# Patient Record
Sex: Male | Born: 1950 | Race: White | Hispanic: No | Marital: Married | Smoking: Former smoker
Health system: Southern US, Community
[De-identification: ages and names within clinical notes are randomized; demographics above are authoritative.]

## PROBLEM LIST (undated history)

## (undated) DIAGNOSIS — S066XAA Traumatic subarachnoid hemorrhage with loss of consciousness status unknown, initial encounter: Secondary | ICD-10-CM

## (undated) DIAGNOSIS — R339 Retention of urine, unspecified: Secondary | ICD-10-CM

## (undated) DIAGNOSIS — K219 Gastro-esophageal reflux disease without esophagitis: Secondary | ICD-10-CM

## (undated) DIAGNOSIS — K2970 Gastritis, unspecified, without bleeding: Secondary | ICD-10-CM

## (undated) DIAGNOSIS — E039 Hypothyroidism, unspecified: Secondary | ICD-10-CM

## (undated) DIAGNOSIS — Z982 Presence of cerebrospinal fluid drainage device: Secondary | ICD-10-CM

## (undated) DIAGNOSIS — S066X9A Traumatic subarachnoid hemorrhage with loss of consciousness of unspecified duration, initial encounter: Secondary | ICD-10-CM

## (undated) DIAGNOSIS — A498 Other bacterial infections of unspecified site: Secondary | ICD-10-CM

## (undated) DIAGNOSIS — S069X9A Unspecified intracranial injury with loss of consciousness of unspecified duration, initial encounter: Secondary | ICD-10-CM

## (undated) DIAGNOSIS — C801 Malignant (primary) neoplasm, unspecified: Secondary | ICD-10-CM

## (undated) DIAGNOSIS — J189 Pneumonia, unspecified organism: Secondary | ICD-10-CM

## (undated) DIAGNOSIS — J9690 Respiratory failure, unspecified, unspecified whether with hypoxia or hypercapnia: Secondary | ICD-10-CM

## (undated) DIAGNOSIS — S0291XA Unspecified fracture of skull, initial encounter for closed fracture: Secondary | ICD-10-CM

## (undated) DIAGNOSIS — J449 Chronic obstructive pulmonary disease, unspecified: Secondary | ICD-10-CM

## (undated) DIAGNOSIS — J150 Pneumonia due to Klebsiella pneumoniae: Secondary | ICD-10-CM

## (undated) HISTORY — PX: MELANOMA EXCISION: SHX5266

## (undated) HISTORY — PX: TRACHEOSTOMY: SUR1362

## (undated) HISTORY — PX: EYE SURGERY: SHX253

## (undated) HISTORY — PX: OTHER SURGICAL HISTORY: SHX169

## (undated) HISTORY — PX: PEG PLACEMENT: SHX5437

---

## 2012-11-10 DIAGNOSIS — J189 Pneumonia, unspecified organism: Secondary | ICD-10-CM

## 2012-11-10 HISTORY — DX: Pneumonia, unspecified organism: J18.9

## 2013-06-10 DIAGNOSIS — S0291XA Unspecified fracture of skull, initial encounter for closed fracture: Secondary | ICD-10-CM

## 2013-06-10 DIAGNOSIS — S0990XA Unspecified injury of head, initial encounter: Secondary | ICD-10-CM

## 2013-06-10 HISTORY — DX: Unspecified fracture of skull, initial encounter for closed fracture: S02.91XA

## 2013-06-10 HISTORY — DX: Unspecified injury of head, initial encounter: S09.90XA

## 2013-11-23 ENCOUNTER — Encounter (HOSPITAL_COMMUNITY): Payer: Self-pay | Admitting: *Deleted
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
## 2013-11-25 ENCOUNTER — Inpatient Hospital Stay (HOSPITAL_COMMUNITY): Payer: Medicare Other | Admitting: Certified Registered Nurse Anesthetist

## 2013-11-25 ENCOUNTER — Inpatient Hospital Stay (HOSPITAL_COMMUNITY): Payer: Medicare Other

## 2013-11-25 ENCOUNTER — Encounter (HOSPITAL_COMMUNITY): Payer: Self-pay | Admitting: *Deleted

## 2013-11-25 ENCOUNTER — Encounter (HOSPITAL_COMMUNITY): Payer: Medicare Other | Admitting: Certified Registered Nurse Anesthetist

## 2013-11-25 ENCOUNTER — Encounter (HOSPITAL_COMMUNITY)
Admission: RE | Disposition: A | Discharge status: Nursing Home (Includes ICF) | Payer: Self-pay | Source: Ambulatory Visit | Attending: Neurosurgery

## 2013-11-25 ENCOUNTER — Inpatient Hospital Stay (HOSPITAL_COMMUNITY)
Admission: RE | Admit: 2013-11-25 | Discharge: 2013-11-28 | DRG: 031 | Disposition: A | Discharge status: Other Acute Inpatient Hospital | Payer: Medicare Other | Source: Ambulatory Visit | Attending: Neurosurgery | Admitting: Neurosurgery

## 2013-11-25 DIAGNOSIS — K219 Gastro-esophageal reflux disease without esophagitis: Secondary | ICD-10-CM | POA: Diagnosis present

## 2013-11-25 DIAGNOSIS — Z79899 Other long term (current) drug therapy: Secondary | ICD-10-CM

## 2013-11-25 DIAGNOSIS — Z87891 Personal history of nicotine dependence: Secondary | ICD-10-CM

## 2013-11-25 DIAGNOSIS — J4489 Other specified chronic obstructive pulmonary disease: Secondary | ICD-10-CM | POA: Diagnosis present

## 2013-11-25 DIAGNOSIS — G9340 Encephalopathy, unspecified: Secondary | ICD-10-CM | POA: Diagnosis present

## 2013-11-25 DIAGNOSIS — T85618A Breakdown (mechanical) of other specified internal prosthetic devices, implants and grafts, initial encounter: Secondary | ICD-10-CM | POA: Diagnosis present

## 2013-11-25 DIAGNOSIS — Y831 Surgical operation with implant of artificial internal device as the cause of abnormal reaction of the patient, or of later complication, without mention of misadventure at the time of the procedure: Secondary | ICD-10-CM | POA: Diagnosis present

## 2013-11-25 DIAGNOSIS — S069XAA Unspecified intracranial injury with loss of consciousness status unknown, initial encounter: Secondary | ICD-10-CM

## 2013-11-25 DIAGNOSIS — Z931 Gastrostomy status: Secondary | ICD-10-CM

## 2013-11-25 DIAGNOSIS — J9620 Acute and chronic respiratory failure, unspecified whether with hypoxia or hypercapnia: Secondary | ICD-10-CM

## 2013-11-25 DIAGNOSIS — E039 Hypothyroidism, unspecified: Secondary | ICD-10-CM | POA: Diagnosis present

## 2013-11-25 DIAGNOSIS — S069X9A Unspecified intracranial injury with loss of consciousness of unspecified duration, initial encounter: Secondary | ICD-10-CM | POA: Diagnosis present

## 2013-11-25 DIAGNOSIS — Z930 Tracheostomy status: Secondary | ICD-10-CM

## 2013-11-25 DIAGNOSIS — Z87820 Personal history of traumatic brain injury: Secondary | ICD-10-CM

## 2013-11-25 DIAGNOSIS — Z888 Allergy status to other drugs, medicaments and biological substances status: Secondary | ICD-10-CM

## 2013-11-25 DIAGNOSIS — T85695A Other mechanical complication of other nervous system device, implant or graft, initial encounter: Principal | ICD-10-CM | POA: Diagnosis present

## 2013-11-25 DIAGNOSIS — I614 Nontraumatic intracerebral hemorrhage in cerebellum: Secondary | ICD-10-CM | POA: Diagnosis present

## 2013-11-25 DIAGNOSIS — G919 Hydrocephalus, unspecified: Secondary | ICD-10-CM | POA: Diagnosis present

## 2013-11-25 DIAGNOSIS — Z85820 Personal history of malignant melanoma of skin: Secondary | ICD-10-CM

## 2013-11-25 DIAGNOSIS — D72829 Elevated white blood cell count, unspecified: Secondary | ICD-10-CM | POA: Diagnosis present

## 2013-11-25 DIAGNOSIS — I10 Essential (primary) hypertension: Secondary | ICD-10-CM | POA: Diagnosis present

## 2013-11-25 DIAGNOSIS — J449 Chronic obstructive pulmonary disease, unspecified: Secondary | ICD-10-CM | POA: Diagnosis present

## 2013-11-25 HISTORY — DX: Retention of urine, unspecified: R33.9

## 2013-11-25 HISTORY — DX: Other bacterial infections of unspecified site: A49.8

## 2013-11-25 HISTORY — DX: Traumatic subarachnoid hemorrhage with loss of consciousness of unspecified duration, initial encounter: S06.6X9A

## 2013-11-25 HISTORY — DX: Hypothyroidism, unspecified: E03.9

## 2013-11-25 HISTORY — DX: Gastritis, unspecified, without bleeding: K29.70

## 2013-11-25 HISTORY — DX: Unspecified fracture of skull, initial encounter for closed fracture: S02.91XA

## 2013-11-25 HISTORY — PX: SHUNT REVISION VENTRICULAR-PERITONEAL: SHX6094

## 2013-11-25 HISTORY — DX: Pneumonia due to Klebsiella pneumoniae: J15.0

## 2013-11-25 HISTORY — DX: Malignant (primary) neoplasm, unspecified: C80.1

## 2013-11-25 HISTORY — DX: Traumatic subarachnoid hemorrhage with loss of consciousness status unknown, initial encounter: S06.6XAA

## 2013-11-25 HISTORY — DX: Pneumonia, unspecified organism: J18.9

## 2013-11-25 HISTORY — DX: Respiratory failure, unspecified, unspecified whether with hypoxia or hypercapnia: J96.90

## 2013-11-25 HISTORY — DX: Gastro-esophageal reflux disease without esophagitis: K21.9

## 2013-11-25 HISTORY — DX: Unspecified intracranial injury with loss of consciousness of unspecified duration, initial encounter: S06.9X9A

## 2013-11-25 HISTORY — DX: Presence of cerebrospinal fluid drainage device: Z98.2

## 2013-11-25 HISTORY — DX: Chronic obstructive pulmonary disease, unspecified: J44.9

## 2013-11-25 LAB — BASIC METABOLIC PANEL
BUN: 23 mg/dL (ref 6–23)
CHLORIDE: 101 meq/L (ref 96–112)
CO2: 29 mEq/L (ref 19–32)
CREATININE: 0.87 mg/dL (ref 0.50–1.35)
Calcium: 10.6 mg/dL — ABNORMAL HIGH (ref 8.4–10.5)
GFR calc Af Amer: >90 mL/min (ref >90)
GFR calc non Af Amer: 89 mL/min — ABNORMAL LOW (ref >90)
GLUCOSE: 111 mg/dL — AB (ref 70–99)
POTASSIUM: 4.5 meq/L (ref 3.7–5.3)
Sodium: 144 mEq/L (ref 137–147)

## 2013-11-25 LAB — CBC WITH DIFFERENTIAL/PLATELET
BASOS PCT: 0 % (ref 0–1)
Basophils Absolute: 0 10*3/uL (ref 0.0–0.1)
Eosinophils Absolute: 0.8 10*3/uL — ABNORMAL HIGH (ref 0.0–0.7)
Eosinophils Relative: 5 % (ref 0–5)
HEMATOCRIT: 40.1 % (ref 39.0–52.0)
Hemoglobin: 13.1 g/dL (ref 13.0–17.0)
LYMPHS ABS: 1.6 10*3/uL (ref 0.7–4.0)
Lymphocytes Relative: 10 % — ABNORMAL LOW (ref 12–46)
MCH: 29.4 pg (ref 26.0–34.0)
MCHC: 32.7 g/dL (ref 30.0–36.0)
MCV: 90.1 fL (ref 78.0–100.0)
Monocytes Absolute: 1.1 10*3/uL — ABNORMAL HIGH (ref 0.1–1.0)
Monocytes Relative: 7 % (ref 3–12)
NEUTROS ABS: 11.8 10*3/uL — AB (ref 1.7–7.7)
NEUTROS PCT: 77 % (ref 43–77)
Platelets: 395 10*3/uL (ref 150–400)
RBC: 4.45 MIL/uL (ref 4.22–5.81)
RDW: 16.2 % — ABNORMAL HIGH (ref 11.5–15.5)
WBC: 15.4 10*3/uL — ABNORMAL HIGH (ref 4.0–10.5)

## 2013-11-25 LAB — MRSA PCR SCREENING: MRSA by PCR: NEGATIVE

## 2013-11-25 SURGERY — REVISION, SHUNT, VENTRICULOPERITONEAL
Anesthesia: General

## 2013-11-25 MED ORDER — PEPTAMEN AF PO LIQD: 70.0000 mL | ORAL | Status: DC

## 2013-11-25 MED ORDER — SODIUM CHLORIDE 0.9 % IJ SOLN: 3.0000 mL | INTRAMUSCULAR | Status: DC | PRN

## 2013-11-25 MED ORDER — 0.9 % SODIUM CHLORIDE (POUR BTL) OPTIME
TOPICAL | Status: DC | PRN
  Administered 2013-11-25: 1000 mL

## 2013-11-25 MED ORDER — CEFAZOLIN SODIUM-DEXTROSE 2-3 GM-% IV SOLR
INTRAVENOUS | Status: AC
  Administered 2013-11-25: 2 g via INTRAVENOUS
  Filled 2013-11-25: qty 50

## 2013-11-25 MED ORDER — PHENOL 1.4 % MT LIQD: 1.0000 | OROMUCOSAL | Status: DC | PRN

## 2013-11-25 MED ORDER — DOCUSATE SODIUM 100 MG PO CAPS
100.0000 mg | ORAL_CAPSULE | Freq: Every day | ORAL | Status: DC
  Filled 2013-11-25: qty 1

## 2013-11-25 MED ORDER — LACOSAMIDE 50 MG PO TABS
100.0000 mg | ORAL_TABLET | Freq: Two times a day (BID) | ORAL | Status: DC
  Administered 2013-11-25 – 2013-11-28 (×6): 100 mg via ORAL
  Filled 2013-11-25 (×12): qty 2

## 2013-11-25 MED ORDER — BUPIVACAINE HCL (PF) 0.25 % IJ SOLN
INTRAMUSCULAR | Status: DC | PRN
  Administered 2013-11-25: 4 mL

## 2013-11-25 MED ORDER — MENTHOL 3 MG MT LOZG: 1.0000 | LOZENGE | OROMUCOSAL | Status: DC | PRN

## 2013-11-25 MED ORDER — SUCRALFATE 1 G PO TABS
1.0000 g | ORAL_TABLET | Freq: Three times a day (TID) | ORAL | Status: DC
  Administered 2013-11-25 – 2013-11-28 (×10): 1 g
  Filled 2013-11-25 (×11): qty 1

## 2013-11-25 MED ORDER — CHLORHEXIDINE GLUCONATE 0.12 % MT SOLN
15.0000 mL | Freq: Two times a day (BID) | OROMUCOSAL | Status: DC
  Administered 2013-11-25 – 2013-11-28 (×6): 15 mL via OROMUCOSAL
  Filled 2013-11-25 (×5): qty 15

## 2013-11-25 MED ORDER — LORAZEPAM 0.5 MG PO TABS
0.2500 mg | ORAL_TABLET | Freq: Two times a day (BID) | ORAL | Status: DC | PRN
  Administered 2013-11-26: 0.25 mg
  Filled 2013-11-25: qty 1

## 2013-11-25 MED ORDER — CEFAZOLIN SODIUM 1-5 GM-% IV SOLN
1.0000 g | Freq: Three times a day (TID) | INTRAVENOUS | Status: AC
  Administered 2013-11-25 – 2013-11-26 (×2): 1 g via INTRAVENOUS
  Filled 2013-11-25 (×4): qty 50

## 2013-11-25 MED ORDER — PHENYLEPHRINE HCL 10 MG/ML IJ SOLN
10.0000 mg | INTRAVENOUS | Status: DC | PRN
  Administered 2013-11-25: 40 ug/min via INTRAVENOUS

## 2013-11-25 MED ORDER — ACETAMINOPHEN 325 MG PO TABS: 650.0000 mg | ORAL_TABLET | ORAL | Status: DC | PRN

## 2013-11-25 MED ORDER — MUPIROCIN 2 % EX OINT
TOPICAL_OINTMENT | CUTANEOUS | Status: AC
  Filled 2013-11-25: qty 22

## 2013-11-25 MED ORDER — LEVOTHYROXINE SODIUM 75 MCG PO TABS
75.0000 ug | ORAL_TABLET | Freq: Every day | ORAL | Status: DC
  Administered 2013-11-26 – 2013-11-28 (×3): 75 ug
  Filled 2013-11-25 (×4): qty 1

## 2013-11-25 MED ORDER — QUETIAPINE FUMARATE 50 MG PO TABS
50.0000 mg | ORAL_TABLET | Freq: Three times a day (TID) | ORAL | Status: DC
  Administered 2013-11-25 – 2013-11-28 (×10): 50 mg
  Filled 2013-11-25 (×12): qty 1

## 2013-11-25 MED ORDER — CLONAZEPAM 0.5 MG PO TABS
0.5000 mg | ORAL_TABLET | Freq: Three times a day (TID) | ORAL | Status: DC | PRN
  Administered 2013-11-27: 0.5 mg
  Filled 2013-11-25: qty 1

## 2013-11-25 MED ORDER — LACTATED RINGERS IV SOLN
INTRAVENOUS | Status: DC
Start: 2013-11-25 — End: 2013-11-25
  Administered 2013-11-25: 10:00:00 via INTRAVENOUS

## 2013-11-25 MED ORDER — IPRATROPIUM-ALBUTEROL 0.5-2.5 (3) MG/3ML IN SOLN: 3.0000 mL | Freq: Four times a day (QID) | RESPIRATORY_TRACT | Status: DC | PRN

## 2013-11-25 MED ORDER — LIDOCAINE HCL (CARDIAC) 20 MG/ML IV SOLN
INTRAVENOUS | Status: DC | PRN
  Administered 2013-11-25: 30 mg via INTRAVENOUS

## 2013-11-25 MED ORDER — PANTOPRAZOLE SODIUM 40 MG PO TBEC
40.0000 mg | DELAYED_RELEASE_TABLET | Freq: Two times a day (BID) | ORAL | Status: DC
  Administered 2013-11-25: 40 mg via ORAL
  Filled 2013-11-25: qty 1

## 2013-11-25 MED ORDER — SODIUM CHLORIDE 0.9 % IJ SOLN
3.0000 mL | Freq: Two times a day (BID) | INTRAMUSCULAR | Status: DC
  Administered 2013-11-25 – 2013-11-28 (×5): 3 mL via INTRAVENOUS

## 2013-11-25 MED ORDER — ACETAMINOPHEN 650 MG RE SUPP: 650.0000 mg | RECTAL | Status: DC | PRN

## 2013-11-25 MED ORDER — ALUM & MAG HYDROXIDE-SIMETH 200-200-20 MG/5ML PO SUSP: 30.0000 mL | Freq: Four times a day (QID) | ORAL | Status: DC | PRN

## 2013-11-25 MED ORDER — ONDANSETRON HCL 4 MG/2ML IJ SOLN: 4.0000 mg | INTRAMUSCULAR | Status: DC | PRN

## 2013-11-25 MED ORDER — VITAL AF 1.2 CAL PO LIQD
70.0000 mL/h | ORAL | Status: DC
  Filled 2013-11-25 (×14): qty 237

## 2013-11-25 MED ORDER — ROCURONIUM BROMIDE 100 MG/10ML IV SOLN
INTRAVENOUS | Status: DC | PRN
  Administered 2013-11-25: 20 mg via INTRAVENOUS

## 2013-11-25 MED ORDER — POTASSIUM CHLORIDE 20 MEQ/15ML (10%) PO LIQD
40.0000 meq | Freq: Every day | ORAL | Status: DC
  Administered 2013-11-25 – 2013-11-28 (×4): 40 meq
  Filled 2013-11-25 (×4): qty 30

## 2013-11-25 MED ORDER — PROPOFOL 10 MG/ML IV BOLUS
INTRAVENOUS | Status: DC | PRN
  Administered 2013-11-25: 30 mg via INTRAVENOUS

## 2013-11-25 MED ORDER — CEFAZOLIN SODIUM-DEXTROSE 2-3 GM-% IV SOLR: 2.0000 g | INTRAVENOUS | Status: DC

## 2013-11-25 MED ORDER — GLYCOPYRROLATE 0.2 MG/ML IJ SOLN
INTRAMUSCULAR | Status: DC | PRN
  Administered 2013-11-25: .4 mg via INTRAVENOUS

## 2013-11-25 MED ORDER — SODIUM CHLORIDE 0.9 % IV SOLN
INTRAVENOUS | Status: DC
  Administered 2013-11-25 – 2013-11-26 (×2): via INTRAVENOUS

## 2013-11-25 MED ORDER — FLUTICASONE PROPIONATE 50 MCG/ACT NA SUSP
1.0000 | Freq: Every day | NASAL | Status: DC
  Administered 2013-11-26 – 2013-11-28 (×3): 1 via NASAL
  Filled 2013-11-25: qty 16

## 2013-11-25 MED ORDER — POTASSIUM CHLORIDE CRYS ER 20 MEQ PO TBCR: 40.0000 meq | EXTENDED_RELEASE_TABLET | Freq: Every day | ORAL | Status: DC

## 2013-11-25 MED ORDER — BISACODYL 10 MG RE SUPP: 10.0000 mg | Freq: Every day | RECTAL | Status: DC | PRN

## 2013-11-25 MED ORDER — BETHANECHOL CHLORIDE 25 MG PO TABS
50.0000 mg | ORAL_TABLET | Freq: Three times a day (TID) | ORAL | Status: DC
  Administered 2013-11-25 – 2013-11-28 (×10): 50 mg via JEJUNOSTOMY
  Filled 2013-11-25 (×11): qty 2

## 2013-11-25 MED ORDER — SERTRALINE HCL 50 MG PO TABS
50.0000 mg | ORAL_TABLET | Freq: Every day | ORAL | Status: DC
  Administered 2013-11-25 – 2013-11-27 (×3): 50 mg
  Filled 2013-11-25 (×4): qty 1

## 2013-11-25 MED ORDER — ZINC SULFATE 220 (50 ZN) MG PO CAPS
220.0000 mg | ORAL_CAPSULE | Freq: Every day | ORAL | Status: DC
  Administered 2013-11-26 – 2013-11-28 (×3): 220 mg
  Filled 2013-11-25 (×4): qty 1

## 2013-11-25 MED ORDER — HYDROMORPHONE HCL PF 1 MG/ML IJ SOLN: 0.5000 mg | INTRAMUSCULAR | Status: DC | PRN

## 2013-11-25 MED ORDER — DOXAZOSIN MESYLATE 1 MG PO TABS
1.0000 mg | ORAL_TABLET | Freq: Every day | ORAL | Status: DC
  Administered 2013-11-25 – 2013-11-27 (×3): 1 mg
  Filled 2013-11-25 (×4): qty 1

## 2013-11-25 MED ORDER — OXYCODONE HCL 5 MG PO TABS: 5.0000 mg | ORAL_TABLET | ORAL | Status: DC | PRN

## 2013-11-25 MED ORDER — SODIUM CHLORIDE 0.9 % IV SOLN: 250.0000 mL | INTRAVENOUS | Status: DC

## 2013-11-25 MED ORDER — NYSTATIN 100000 UNIT/GM EX POWD
15.0000 g | Freq: Two times a day (BID) | CUTANEOUS | Status: DC
  Administered 2013-11-25 – 2013-11-28 (×6): 15 g via TOPICAL
  Filled 2013-11-25 (×2): qty 15

## 2013-11-25 MED ORDER — DOCUSATE SODIUM 50 MG/5ML PO LIQD
100.0000 mg | Freq: Every day | ORAL | Status: DC
  Administered 2013-11-25 – 2013-11-28 (×4): 100 mg
  Filled 2013-11-25 (×5): qty 10

## 2013-11-25 MED ORDER — LACTATED RINGERS IV SOLN
INTRAVENOUS | Status: DC | PRN
  Administered 2013-11-25: 15:00:00 via INTRAVENOUS

## 2013-11-25 MED ORDER — NEOSTIGMINE METHYLSULFATE 1 MG/ML IJ SOLN
INTRAMUSCULAR | Status: DC | PRN
  Administered 2013-11-25: 3 mg via INTRAVENOUS

## 2013-11-25 MED ORDER — PROPRANOLOL HCL 10 MG PO TABS
10.0000 mg | ORAL_TABLET | Freq: Three times a day (TID) | ORAL | Status: DC
  Administered 2013-11-25 – 2013-11-28 (×10): 10 mg
  Filled 2013-11-25 (×11): qty 1

## 2013-11-25 MED ORDER — LOPERAMIDE HCL 2 MG PO CAPS
2.0000 mg | ORAL_CAPSULE | ORAL | Status: DC | PRN
  Filled 2013-11-25: qty 1

## 2013-11-25 MED ORDER — SODIUM CHLORIDE 0.9 % IR SOLN
Status: DC | PRN
  Administered 2013-11-25: 15:00:00

## 2013-11-25 MED ORDER — MUPIROCIN 2 % EX OINT
1.0000 "application " | TOPICAL_OINTMENT | Freq: Three times a day (TID) | CUTANEOUS | Status: DC
  Administered 2013-11-25 – 2013-11-28 (×9): 1 via NASAL
  Filled 2013-11-25: qty 22

## 2013-11-25 MED ORDER — SENNA 8.6 MG PO TABS
1.0000 | ORAL_TABLET | Freq: Two times a day (BID) | ORAL | Status: DC
  Administered 2013-11-25 – 2013-11-28 (×6): 8.6 mg via ORAL
  Filled 2013-11-25 (×7): qty 1

## 2013-11-25 SURGICAL SUPPLY — 80 items
BAG DECANTER FOR FLEXI CONT (MISCELLANEOUS) ×3 IMPLANT
BANDAGE ADHESIVE 1X3 (GAUZE/BANDAGES/DRESSINGS) ×9 IMPLANT
BANDAGE GAUZE ELAST BULKY 4 IN (GAUZE/BANDAGES/DRESSINGS) IMPLANT
BENZOIN TINCTURE PRP APPL 2/3 (GAUZE/BANDAGES/DRESSINGS) ×3 IMPLANT
BLADE SURG 10 STRL SS (BLADE) ×6 IMPLANT
BLADE SURG 11 STRL SS (BLADE) ×3 IMPLANT
BRUSH SCRUB EZ 1% IODOPHOR (MISCELLANEOUS) ×3 IMPLANT
BRUSH SCRUB EZ PLAIN DRY (MISCELLANEOUS) ×3 IMPLANT
BUR ACORN 6.0 PRECISION (BURR) ×2 IMPLANT
BUR ACORN 6.0MM PRECISION (BURR) ×1
CANISTER SUCT 3000ML (MISCELLANEOUS) ×3 IMPLANT
CATH VENTRICULAR 14CMX1.4MM (INSTRUMENTS) ×6 IMPLANT
CLIP RANEY DISP (INSTRUMENTS) IMPLANT
CONT SPEC 4OZ CLIKSEAL STRL BL (MISCELLANEOUS) IMPLANT
CORDS BIPOLAR (ELECTRODE) ×3 IMPLANT
COVER MAYO STAND STRL (DRAPES) ×3 IMPLANT
DRAPE INCISE IOBAN 85X60 (DRAPES) ×3 IMPLANT
DRAPE ORTHO SPLIT 77X108 STRL (DRAPES) ×2
DRAPE POUCH INSTRU U-SHP 10X18 (DRAPES) ×3 IMPLANT
DRAPE SURG 17X23 STRL (DRAPES) IMPLANT
DRAPE SURG ORHT 6 SPLT 77X108 (DRAPES) ×1 IMPLANT
DRESSING TELFA 8X3 (GAUZE/BANDAGES/DRESSINGS) ×3 IMPLANT
DRSG OPSITE 4X5.5 SM (GAUZE/BANDAGES/DRESSINGS) ×3 IMPLANT
ELECT CAUTERY BLADE 6.4 (BLADE) ×3 IMPLANT
ELECT REM PT RETURN 9FT ADLT (ELECTROSURGICAL) ×3
ELECTRODE REM PT RTRN 9FT ADLT (ELECTROSURGICAL) ×1 IMPLANT
GLOVE BIOGEL PI IND STRL 6.5 (GLOVE) ×1 IMPLANT
GLOVE BIOGEL PI INDICATOR 6.5 (GLOVE) ×2
GLOVE ECLIPSE 8.5 STRL (GLOVE) ×3 IMPLANT
GLOVE EXAM NITRILE LRG STRL (GLOVE) IMPLANT
GLOVE EXAM NITRILE MD LF STRL (GLOVE) IMPLANT
GLOVE EXAM NITRILE XL STR (GLOVE) IMPLANT
GLOVE EXAM NITRILE XS STR PU (GLOVE) IMPLANT
GLOVE INDICATOR 7.5 STRL GRN (GLOVE) ×3 IMPLANT
GLOVE SS BIOGEL STRL SZ 6.5 (GLOVE) ×1 IMPLANT
GLOVE SUPERSENSE BIOGEL SZ 6.5 (GLOVE) ×2
GLOVE SURG SS PI 7.0 STRL IVOR (GLOVE) ×3 IMPLANT
GOWN BRE IMP SLV AUR LG STRL (GOWN DISPOSABLE) IMPLANT
GOWN BRE IMP SLV AUR XL STRL (GOWN DISPOSABLE) IMPLANT
GOWN STRL REIN 2XL LVL4 (GOWN DISPOSABLE) IMPLANT
HEMOSTAT SURGICEL 2X14 (HEMOSTASIS) IMPLANT
KIT BASIN OR (CUSTOM PROCEDURE TRAY) ×3 IMPLANT
KIT ROOM TURNOVER OR (KITS) ×3 IMPLANT
MARKER SKIN DUAL TIP RULER LAB (MISCELLANEOUS) ×3 IMPLANT
NEEDLE BLUNT 16X1.5 OR ONLY (NEEDLE) IMPLANT
NEEDLE HYPO 18GX1.5 BLUNT FILL (NEEDLE) ×3 IMPLANT
NS IRRIG 1000ML POUR BTL (IV SOLUTION) ×3 IMPLANT
PAD ARMBOARD 7.5X6 YLW CONV (MISCELLANEOUS) ×3 IMPLANT
PATTIES SURGICAL .5 X.5 (GAUZE/BANDAGES/DRESSINGS) IMPLANT
PATTIES SURGICAL .5 X3 (DISPOSABLE) IMPLANT
PENCIL BUTTON HOLSTER BLD 10FT (ELECTRODE) ×3 IMPLANT
RUBBERBAND STERILE (MISCELLANEOUS) IMPLANT
SHEATH PERITONEAL INTRO 46 (MISCELLANEOUS) IMPLANT
SHEATH PERITONEAL INTRO 61 (MISCELLANEOUS) IMPLANT
SPONGE GAUZE 4X4 12PLY (GAUZE/BANDAGES/DRESSINGS) ×3 IMPLANT
SPONGE INTESTINAL PEANUT (DISPOSABLE) IMPLANT
SPONGE LAP 4X18 X RAY DECT (DISPOSABLE) ×3 IMPLANT
SPONGE SURGIFOAM ABS GEL SZ50 (HEMOSTASIS) IMPLANT
STAPLER VISISTAT 35W (STAPLE) ×3 IMPLANT
SUT BONE WAX W31G (SUTURE) ×3 IMPLANT
SUT CHROMIC 3 0 SH 27 (SUTURE) IMPLANT
SUT ETHILON 3 0 FSL (SUTURE) IMPLANT
SUT ETHILON 4 0 PS 2 18 (SUTURE) IMPLANT
SUT NURALON 4 0 TR CR/8 (SUTURE) IMPLANT
SUT SILK 0 TIES 10X30 (SUTURE) IMPLANT
SUT SILK 2 0 TIES 17X18 (SUTURE) ×2
SUT SILK 2-0 18XBRD TIE BLK (SUTURE) ×1 IMPLANT
SUT SILK 3 0 SH 30 (SUTURE) IMPLANT
SUT VIC AB 2-0 CT2 18 VCP726D (SUTURE) ×3 IMPLANT
SUT VIC AB 3-0 SH 8-18 (SUTURE) ×3 IMPLANT
SUT VICRYL 4-0 PS2 18IN ABS (SUTURE) IMPLANT
SYR 5ML LL (SYRINGE) IMPLANT
TAPE UMBILICAL 1/8 X36 TWILL (MISCELLANEOUS) ×3 IMPLANT
TOWEL OR 17X24 6PK STRL BLUE (TOWEL DISPOSABLE) ×6 IMPLANT
TOWEL OR 17X26 10 PK STRL BLUE (TOWEL DISPOSABLE) ×3 IMPLANT
TRAY ENT MC OR (CUSTOM PROCEDURE TRAY) ×3 IMPLANT
TRAY FOLEY CATH 14FRSI W/METER (CATHETERS) IMPLANT
UNDERPAD 30X30 INCONTINENT (UNDERPADS AND DIAPERS) ×3 IMPLANT
Ventricular catheter straight ×3 IMPLANT
WATER STERILE IRR 1000ML POUR (IV SOLUTION) ×3 IMPLANT

## 2013-11-25 NOTE — Progress Notes (Signed)
eLink Physician-Brief Progress Note Patient Name: Ruben Meyer DOB: April 22, 1951 MRN: [id number]  Date of Service  11/25/2013   HPI/Events of Note   Pt on vent s/p crani for VP shunt malfunction.  PCCM consulted for ICU/vent care  eICU Interventions  Full PCCM note to follow See orders   Intervention Category Major Interventions: Respiratory failure - evaluation and management  Asencion Noble 11/25/2013, 5:27 PM

## 2013-11-25 NOTE — Anesthesia Procedure Notes (Signed)
Performed by: Izora Gala Comments: Patient has permanent tracheostomy

## 2013-11-25 NOTE — Anesthesia Postprocedure Evaluation (Signed)
  Anesthesia Post-op Note  Patient: Ruben Meyer  Procedure(s) Performed: Procedure(s): SHUNT REVISION VENTRICULAR-PERITONEAL (N/A)  Patient Location: ICU  Anesthesia Type:General  Level of Consciousness: awake and confused  Airway and Oxygen Therapy: Patient placed on Ventilator (see vital sign flow sheet for setting)  Post-op Pain: none  Post-op Assessment: Post-op Vital signs reviewed  Post-op Vital Signs: Reviewed  Complications: No apparent anesthesia complications

## 2013-11-25 NOTE — Brief Op Note (Signed)
11/25/2013  3:42 PM  PATIENT:  Ruben Meyer  63 y.o. male  PRE-OPERATIVE DIAGNOSIS:  occluded shunt  POST-OPERATIVE DIAGNOSIS:  occluded shunt  PROCEDURE:  Procedure(s): SHUNT REVISION VENTRICULAR-PERITONEAL (N/A)  SURGEON:  Surgeon(s) and Role:    * Charlie Pitter, MD - Primary  PHYSICIAN ASSISTANT:   ASSISTANTS: none   ANESTHESIA:   general  EBL:     BLOOD ADMINISTERED:none  DRAINS: none   LOCAL MEDICATIONS USED:  LIDOCAINE   SPECIMEN:  No Specimen  DISPOSITION OF SPECIMEN:  N/A  COUNTS:  YES  TOURNIQUET:  * No tourniquets in log *  DICTATION: .Dragon Dictation  PLAN OF CARE: Admit to inpatient   PATIENT DISPOSITION:  PACU - hemodynamically stable.   Delay start of Pharmacological VTE agent (>24hrs) due to surgical blood loss or risk of bleeding: yes

## 2013-11-25 NOTE — Transfer of Care (Signed)
Immediate Anesthesia Transfer of Care Note  Patient: Ruben Meyer  Procedure(s) Performed: Procedure(s): SHUNT REVISION VENTRICULAR-PERITONEAL (N/A)  Patient Location: ICU  Anesthesia Type:General  Level of Consciousness: sedated and Patient remains intubated per anesthesia plan  Airway & Oxygen Therapy: Patient Spontanous Breathing and Patient placed on Ventilator (see vital sign flow sheet for setting)  Post-op Assessment: Report given to PACU RN and Post -op Vital signs reviewed and stable  Post vital signs: Reviewed and stable  Complications: No apparent anesthesia complications
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
## 2013-11-25 NOTE — Progress Notes (Signed)
eLink Physician-Brief Progress Note Patient Name: Ruben Meyer DOB: 08-Jun-1951 MRN: [id number]  Date of Service  11/25/2013   HPI/Events of Note  Not vent dependent No resp distress  eICU Interventions  Instructed RT to wean, TCT if able   Intervention Category Major Interventions: Respiratory failure - evaluation and management  MCQUAID, DOUGLAS 11/25/2013, 9:28 PM
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
## 2013-11-25 NOTE — Progress Notes (Signed)
Dr. Lake Bells instructed RT to place Ruben Meyer on wean through vent and if tolerated place on ATC. RT will continue to wean.
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
## 2013-11-25 NOTE — Preoperative (Signed)
Beta Blockers   Reason not to administer Beta Blockers:BB this a.m. 

## 2013-11-26 ENCOUNTER — Inpatient Hospital Stay (HOSPITAL_COMMUNITY): Payer: Medicare Other

## 2013-11-26 DIAGNOSIS — S069XAA Unspecified intracranial injury with loss of consciousness status unknown, initial encounter: Secondary | ICD-10-CM

## 2013-11-26 DIAGNOSIS — Z930 Tracheostomy status: Secondary | ICD-10-CM

## 2013-11-26 DIAGNOSIS — J9620 Acute and chronic respiratory failure, unspecified whether with hypoxia or hypercapnia: Secondary | ICD-10-CM

## 2013-11-26 DIAGNOSIS — G911 Obstructive hydrocephalus: Secondary | ICD-10-CM

## 2013-11-26 DIAGNOSIS — S069X9A Unspecified intracranial injury with loss of consciousness of unspecified duration, initial encounter: Secondary | ICD-10-CM

## 2013-11-26 LAB — BASIC METABOLIC PANEL
BUN: 25 mg/dL — ABNORMAL HIGH (ref 6–23)
CALCIUM: 10.1 mg/dL (ref 8.4–10.5)
CO2: 29 mEq/L (ref 19–32)
CREATININE: 0.82 mg/dL (ref 0.50–1.35)
Chloride: 107 mEq/L (ref 96–112)
GFR calc non Af Amer: >90 mL/min (ref >90)
Glucose, Bld: 88 mg/dL (ref 70–99)
Potassium: 4.1 mEq/L (ref 3.7–5.3)
Sodium: 148 mEq/L — ABNORMAL HIGH (ref 137–147)

## 2013-11-26 LAB — GLUCOSE, CAPILLARY
GLUCOSE-CAPILLARY: 93 mg/dL (ref 70–99)
GLUCOSE-CAPILLARY: 95 mg/dL (ref 70–99)
GLUCOSE-CAPILLARY: 99 mg/dL (ref 70–99)
Glucose-Capillary: 98 mg/dL (ref 70–99)

## 2013-11-26 LAB — URINE MICROSCOPIC-ADD ON

## 2013-11-26 LAB — CBC WITH DIFFERENTIAL/PLATELET
BASOS PCT: 0 % (ref 0–1)
Basophils Absolute: 0 10*3/uL (ref 0.0–0.1)
Eosinophils Absolute: 0.9 10*3/uL — ABNORMAL HIGH (ref 0.0–0.7)
Eosinophils Relative: 7 % — ABNORMAL HIGH (ref 0–5)
HCT: 35.6 % — ABNORMAL LOW (ref 39.0–52.0)
Hemoglobin: 11.1 g/dL — ABNORMAL LOW (ref 13.0–17.0)
Lymphocytes Relative: 14 % (ref 12–46)
Lymphs Abs: 1.8 10*3/uL (ref 0.7–4.0)
MCH: 28.9 pg (ref 26.0–34.0)
MCHC: 31.2 g/dL (ref 30.0–36.0)
MCV: 92.7 fL (ref 78.0–100.0)
Monocytes Absolute: 1.3 10*3/uL — ABNORMAL HIGH (ref 0.1–1.0)
Monocytes Relative: 10 % (ref 3–12)
NEUTROS ABS: 8.8 10*3/uL — AB (ref 1.7–7.7)
NEUTROS PCT: 69 % (ref 43–77)
Platelets: 405 10*3/uL — ABNORMAL HIGH (ref 150–400)
RBC: 3.84 MIL/uL — ABNORMAL LOW (ref 4.22–5.81)
RDW: 16.7 % — AB (ref 11.5–15.5)
WBC: 12.8 10*3/uL — ABNORMAL HIGH (ref 4.0–10.5)

## 2013-11-26 LAB — URINALYSIS, ROUTINE W REFLEX MICROSCOPIC
Bilirubin Urine: NEGATIVE
Glucose, UA: NEGATIVE mg/dL
Ketones, ur: 15 mg/dL — AB
NITRITE: NEGATIVE
PROTEIN: 30 mg/dL — AB
SPECIFIC GRAVITY, URINE: 1.023 (ref 1.005–1.030)
Urobilinogen, UA: 0.2 mg/dL (ref 0.0–1.0)
pH: 8 (ref 5.0–8.0)

## 2013-11-26 MED ORDER — VITAL AF 1.2 CAL PO LIQD
1000.0000 mL | ORAL | Status: DC
  Administered 2013-11-26: 1000 mL
  Filled 2013-11-26 (×6): qty 1000

## 2013-11-26 MED ORDER — FREE WATER
200.0000 mL | Freq: Three times a day (TID) | Status: DC
  Administered 2013-11-26 – 2013-11-28 (×7): 200 mL

## 2013-11-26 MED ORDER — PANTOPRAZOLE SODIUM 40 MG PO PACK
40.0000 mg | PACK | Freq: Two times a day (BID) | ORAL | Status: DC
  Administered 2013-11-26 – 2013-11-28 (×5): 40 mg
  Filled 2013-11-26 (×8): qty 20

## 2013-11-26 NOTE — Progress Notes (Signed)
PT Cancellation Note  Patient Details Name: Ruben Meyer MRN: [id number] DOB: [birth year]   Cancelled Treatment:    Reason Eval/Treat Not Completed: Medical issues which prohibited therapy. Pt is level 4 ranchos at this time and will need TBI team to evaluate pt for placeement per RN. RN notified that pt would be evaluated Monday.    Elie Confer [place], [place] 11/26/2013, 12:49 PM
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
## 2013-11-26 NOTE — Progress Notes (Signed)
Pt has tolerated 28% ATC overnight without complication. RR 16-18, SATS 96-100%. Pt has strong, productive cough. No distress noted. RT will continue to monitor.
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
## 2013-11-26 NOTE — Progress Notes (Signed)
Orthopedic Tech Progress Note Patient Details:  Ruben Meyer [birth year] [id number]  Patient ID: Ruben Meyer, male   DOB: July 17, 1951, 63 y.o.   MRN: [id number] Viewed order from rn order list  Ruben Meyer 11/26/2013, 1:41 PM

## 2013-11-26 NOTE — Progress Notes (Signed)
Pt removed from vent following 1hr 5/5 CPAP wean. Pt tolerated wean very well RR 16-20, VT 400-450. Patient placed on 28% ATC. No distress noted. RT will continue to monitor.

## 2013-11-26 NOTE — Progress Notes (Signed)
Orthopedic Tech Progress Note Patient Details:  Trent Gabler Oct 03, 1951 [id number]  Ortho Devices Type of Ortho Device: Abdominal binder Ortho Device/Splint Location: abdomen Ortho Device/Splint Interventions: Ordered Abdominal binder left in pt's room; rn notified  Shiela Bruns 11/26/2013, 1:40 PM

## 2013-11-26 NOTE — Progress Notes (Signed)
Patient ID: Ruben Meyer, male   DOB: 11/03/1951, 63 y.o.   MRN: [id number] Stable, open eyes, tracks. To star tube feeding. Dressing dry

## 2013-11-27 LAB — CBC
HEMATOCRIT: 33.8 % — AB (ref 39.0–52.0)
HEMOGLOBIN: 10.8 g/dL — AB (ref 13.0–17.0)
MCH: 29.1 pg (ref 26.0–34.0)
MCHC: 32 g/dL (ref 30.0–36.0)
MCV: 91.1 fL (ref 78.0–100.0)
Platelets: 396 10*3/uL (ref 150–400)
RBC: 3.71 MIL/uL — AB (ref 4.22–5.81)
RDW: 16.3 % — ABNORMAL HIGH (ref 11.5–15.5)
WBC: 9.6 10*3/uL (ref 4.0–10.5)

## 2013-11-27 LAB — BASIC METABOLIC PANEL
BUN: 24 mg/dL — AB (ref 6–23)
CHLORIDE: 106 meq/L (ref 96–112)
CO2: 29 meq/L (ref 19–32)
CREATININE: 0.67 mg/dL (ref 0.50–1.35)
Calcium: 9.6 mg/dL (ref 8.4–10.5)
GFR calc non Af Amer: >90 mL/min (ref >90)
Glucose, Bld: 108 mg/dL — ABNORMAL HIGH (ref 70–99)
Potassium: 3.6 mEq/L — ABNORMAL LOW (ref 3.7–5.3)
Sodium: 148 mEq/L — ABNORMAL HIGH (ref 137–147)

## 2013-11-27 LAB — PHOSPHORUS: Phosphorus: 3.8 mg/dL (ref 2.3–4.6)

## 2013-11-27 LAB — URINE CULTURE
CULTURE: NO GROWTH
Colony Count: NO GROWTH

## 2013-11-27 LAB — GLUCOSE, CAPILLARY
GLUCOSE-CAPILLARY: 115 mg/dL — AB (ref 70–99)
Glucose-Capillary: 106 mg/dL — ABNORMAL HIGH (ref 70–99)
Glucose-Capillary: 113 mg/dL — ABNORMAL HIGH (ref 70–99)
Glucose-Capillary: 113 mg/dL — ABNORMAL HIGH (ref 70–99)
Glucose-Capillary: 92 mg/dL (ref 70–99)

## 2013-11-27 LAB — MAGNESIUM: Magnesium: 2 mg/dL (ref 1.5–2.5)
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
## 2013-11-27 NOTE — Progress Notes (Signed)
Patient ID: Ruben Meyer, male   DOB: 10/24/1951, 63 y.o.   MRN: [id number] Back to base line. Neuro stble. CCM agrees with transfer to floor

## 2013-11-28 LAB — GLUCOSE, CAPILLARY
GLUCOSE-CAPILLARY: 113 mg/dL — AB (ref 70–99)
Glucose-Capillary: 109 mg/dL — ABNORMAL HIGH (ref 70–99)
Glucose-Capillary: 86 mg/dL (ref 70–99)

## 2013-11-28 NOTE — Progress Notes (Signed)
Pt to return to [facility] today. Rm number is [phone] and MD is Dr. Vassie Loll Eyk.  Number to call for report is [phone]. [phone].  This was given to assigned RN in neuro ICU with instruction to call [facility] when she is ready for pt to transfer. [facility] number is [phone].  The EMS/[facility] report from [facility] was printed and sent to unit 84M to give to transport team when they arrive. RN will get the acute-to-acute transfer form signed and has already printed the medical necessity form for transport.  Please call with any further needs before transfer.   Sandi Mariscal, RN BSN [facility] CCM  Case Manager, Trauma Service/Unit 84M [phone]

## 2013-11-28 NOTE — Progress Notes (Signed)
UR completed. Pt is from [facility].  [facility] liaison has called this am and is willing to take patient back today.  Assigned RN given this message to pass along to neurosurgeon.  Sandi Mariscal, RN BSN [place] CCM Trauma/Neuro ICU Case Manager [phone]

## 2013-11-28 NOTE — Progress Notes (Signed)
PT Cancellation Note  Patient Details Name: Griffey Nicasio MRN: [id number] DOB: [birth year]   Cancelled Treatment:    Reason Eval/Treat Not Completed: PT screened, no needs identified, will sign off. PT to defer to [facility]. Per family and RN pt at baseline level of function. Acute PT signing off at this time. Please re-consult if needed in future.   Kingsley Callander 11/28/2013, 10:01 AM

## 2013-11-28 NOTE — Progress Notes (Signed)
OT Cancellation Note  Patient Details Name: Ruben Meyer MRN: [id number] DOB: 07-19-1951   Cancelled Treatment:    Reason Eval/Treat Not Completed: Other (comment) PT apparently at baseline. Will defer OT to [facility]. OT signing off [facility], OTR/L  [phone] 11/28/2013 11/28/2013, 10:20 AM

## 2013-11-28 NOTE — Clinical Social Work Note (Signed)
Clinical Social Worker received referral for possible ST-SNF placement.  Chart reviewed.  Patient from [facility] and plans to return at discharge.  Spoke with RN Case Manager who will follow up with [facility] and facilitate patient discharge.  CSW signing off - please re consult if social work needs arise.  Barbette Or, [place]

## 2013-11-28 NOTE — Discharge Summary (Signed)
Physician Discharge Summary  Patient ID: Ruben Meyer MRN: [id number] DOB/AGE: November 10, 1951 63 y.o.  Admit date: 11/25/2013 Discharge date: 11/28/2013  Admission Diagnoses:  Discharge Diagnoses:  Principal Problem:   Tracheostomy status Active Problems:   Traumatic brain injury   Intracranial hemorrhage, cerebellar   Hydrocephalus   Shunt malfunction   Acute and chronic respiratory failure   Discharged Condition: fair  Hospital Course: The patient was admitted to the hospital and taken to the operating room for revision of his right frontal VP shunt. Findings at the time of the operation were that of proximal occlusion of his VP shunt system. The proximal catheter was removed and changed to a new catheter. Postoperatively the patient has improved dramatically. He is now wide-awake. He appears aware. He is still restless and encephalopathic but will follow commands readily with all 4 extremities area he has been afebrile. His wound is clean and dry. He is ready for discharge back to his long-term care facility.  Patient's wound should be covered. Staples should be removed 2 weeks postoperatively.  Consults:   Significant Diagnostic Studies:   Treatments:   Discharge Exam: Blood pressure 114/72, pulse 74, temperature 98.4 F (36.9 C), temperature source Axillary, resp. rate 21, height 5\' 10"  (1.778 m), weight 61.9 kg (136 lb 7.4 oz), SpO2 100.00%. He is awake and aware. He has a tracheostomy in place. He will mouth answers to questions. He follows commands with all 4 extremities. Wound clean and dry. Chest and abdomen benign area and    Disposition: Final discharge disposition not confirmed     Medication List         acetaminophen 500 MG tablet  Commonly known as:  TYLENOL  1,000 mg by Feeding Tube route every 6 (six) hours as needed for mild pain.     alum & mag hydroxide-simeth 200-200-20 MG/5ML suspension  Commonly known as:  MAALOX/MYLANTA  30 mLs by Feeding Tube  route every 4 (four) hours as needed for indigestion or heartburn.     bethanechol 50 MG tablet  Commonly known as:  URECHOLINE  50 mg by Feeding Tube route 3 (three) times daily.     bisacodyl 10 MG suppository  Commonly known as:  DULCOLAX  Place 10 mg rectally daily as needed for moderate constipation.     chlorhexidine 0.12 % solution  Commonly known as:  PERIDEX  Use as directed 15 mLs in the mouth or throat 2 (two) times daily.     clonazePAM 0.5 MG tablet  Commonly known as:  KLONOPIN  0.5 mg by Feeding Tube route 3 (three) times daily as needed for anxiety.     docusate sodium 100 MG capsule  Commonly known as:  COLACE  100 mg by Feeding Tube route daily.     doxazosin 1 MG tablet  Commonly known as:  CARDURA  1 mg by Feeding Tube route at bedtime.     fluticasone 50 MCG/ACT nasal spray  Commonly known as:  FLONASE  Place 1 spray into both nostrils daily.     heparin 5000 UNIT/ML injection  Inject 5,000 Units into the skin every 12 (twelve) hours.     ipratropium-albuterol 0.5-2.5 (3) MG/3ML Soln  Commonly known as:  DUONEB  Take 3 mLs by nebulization every 6 (six) hours as needed (for shortness of breath).     Lacosamide 100 MG Tabs  Take 100 mg by mouth 2 (two) times daily.     levothyroxine 75 MCG tablet  Commonly known as:  SYNTHROID, LEVOTHROID  75 mcg by Feeding Tube route daily before breakfast.     loperamide 2 MG capsule  Commonly known as:  IMODIUM  Take 2 mg by mouth every 4 (four) hours as needed for diarrhea or loose stools.     LORazepam 0.5 MG tablet  Commonly known as:  ATIVAN  0.25 mg by Feeding Tube route every 12 (twelve) hours as needed for anxiety.     magnesium hydroxide 400 MG/5ML suspension  Commonly known as:  MILK OF MAGNESIA  Take 30 mLs by mouth daily as needed for mild constipation.     mupirocin ointment 2 %  Commonly known as:  BACTROBAN  Place 1 application into the nose 3 (three) times daily.     nystatin powder   Commonly known as:  MYCOSTATIN  Apply 15 g topically 2 (two) times daily.     oxyCODONE 5 MG immediate release tablet  Commonly known as:  Oxy IR/ROXICODONE  5 mg by Feeding Tube route every 4 (four) hours as needed for severe pain.     pantoprazole 40 MG tablet  Commonly known as:  PROTONIX  40 mg by Feeding Tube route 2 (two) times daily.     PEPTAMEN AF Liqd  Take 70 mLs by mouth See admin instructions. *86ml/hr cont.*     potassium chloride SA 20 MEQ tablet  Commonly known as:  K-DUR,KLOR-CON  40 mEq by Feeding Tube route daily.     PRESCRIPTION MEDICATION  Apply 1 application topically 2 (two) times daily. *zinc oxide/menthol*     propranolol 10 MG tablet  Commonly known as:  INDERAL  10 mg by Feeding Tube route 3 (three) times daily.     QUEtiapine 50 MG tablet  Commonly known as:  SEROQUEL  50 mg by Feeding Tube route 3 (three) times daily.     sertraline 50 MG tablet  Commonly known as:  ZOLOFT  50 mg by Feeding Tube route at bedtime.     sucralfate 1 G tablet  Commonly known as:  CARAFATE  1 g by Feeding Tube route 3 (three) times daily before meals.     zinc sulfate 220 MG capsule  220 mg by Feeding Tube route daily.           Follow-up Information   Follow up with Thaddeaus Monica A, MD In 1 month.   Specialty:  Neurosurgery   Contact information:   [address] [place] [address] [phone]       Signed: Katalina Magri A 11/28/2013, 9:52 AM

## 2013-11-29 ENCOUNTER — Encounter (HOSPITAL_COMMUNITY): Payer: Self-pay | Admitting: Neurosurgery

## 2013-12-07 ENCOUNTER — Other Ambulatory Visit: Payer: Self-pay | Admitting: Neurosurgery

## 2013-12-09 ENCOUNTER — Inpatient Hospital Stay (HOSPITAL_COMMUNITY): Admission: RE | Admit: 2013-12-09 | Payer: Medicare Other | Source: Ambulatory Visit | Admitting: Neurosurgery

## 2013-12-09 ENCOUNTER — Encounter (HOSPITAL_COMMUNITY): Admission: RE | Payer: Self-pay | Source: Ambulatory Visit

## 2013-12-09 SURGERY — SHUNT INSERTION VENTRICULAR-PERITONEAL
Anesthesia: General

## 2014-01-08 DEATH — deceased

## 2014-08-14 IMAGING — DX DG ABD PORTABLE 1V
1 series · 1 of 1 positions shown · non-contrast
Comparison: None.

CLINICAL DATA: Preoperative examination (evaluate shunt catheter
tubing and gastrostomy tube)

EXAM:
PORTABLE ABDOMEN - 1 VIEW

[supine ap]
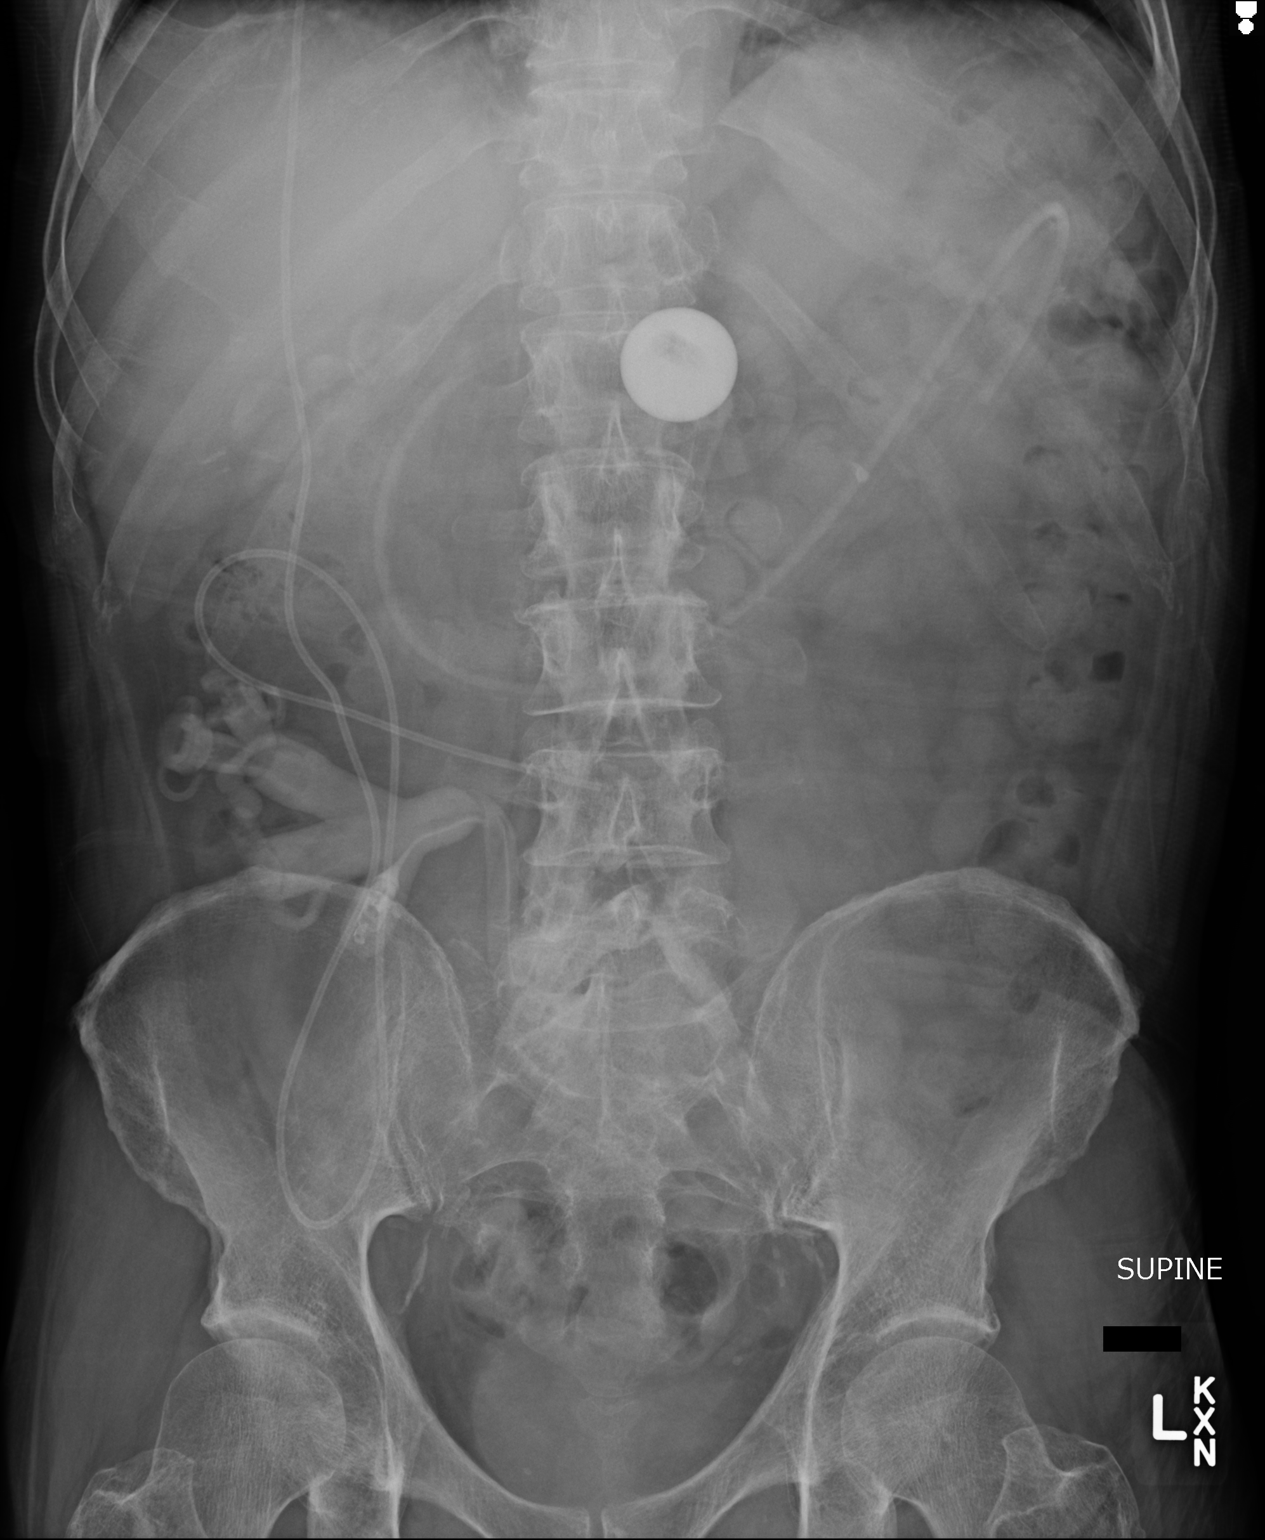

[1 of 1 positions shown; findings below may reference images not displayed]

FINDINGS: Nonobstructive bowel gas pattern. Nondiagnostic evaluation for
pneumoperitoneum secondary to supine positioning and exclusion of
the lower thorax. No definite pneumatosis or portal venous gas.

Vascular calcifications overlie the lower pelvis. Otherwise, no
definite intra-abdominal calcifications.

Ventriculoperitoneal catheter tubing overlies the right mid
hemiabdomen. No definite evidence of catheter kinking or fracture
given this solitary projection. .

A gastrostomy tube appears appropriately positioned with tip
projecting over the expected location of the proximal jejunum.

No definite acute osseus abnormalities.
IMPRESSION: 1. Nonobstructed bowel-gas pattern.
2. Ventriculoperitoneal catheter tubing and gastrojejunostomy tube
as above.

## 2014-08-14 IMAGING — DX DG CHEST 1V PORT
1 series · 1 of 1 positions shown · non-contrast
Comparison: None.

CLINICAL DATA: Preop shunt malfunction

EXAM:
PORTABLE CHEST - 1 VIEW

[ap]
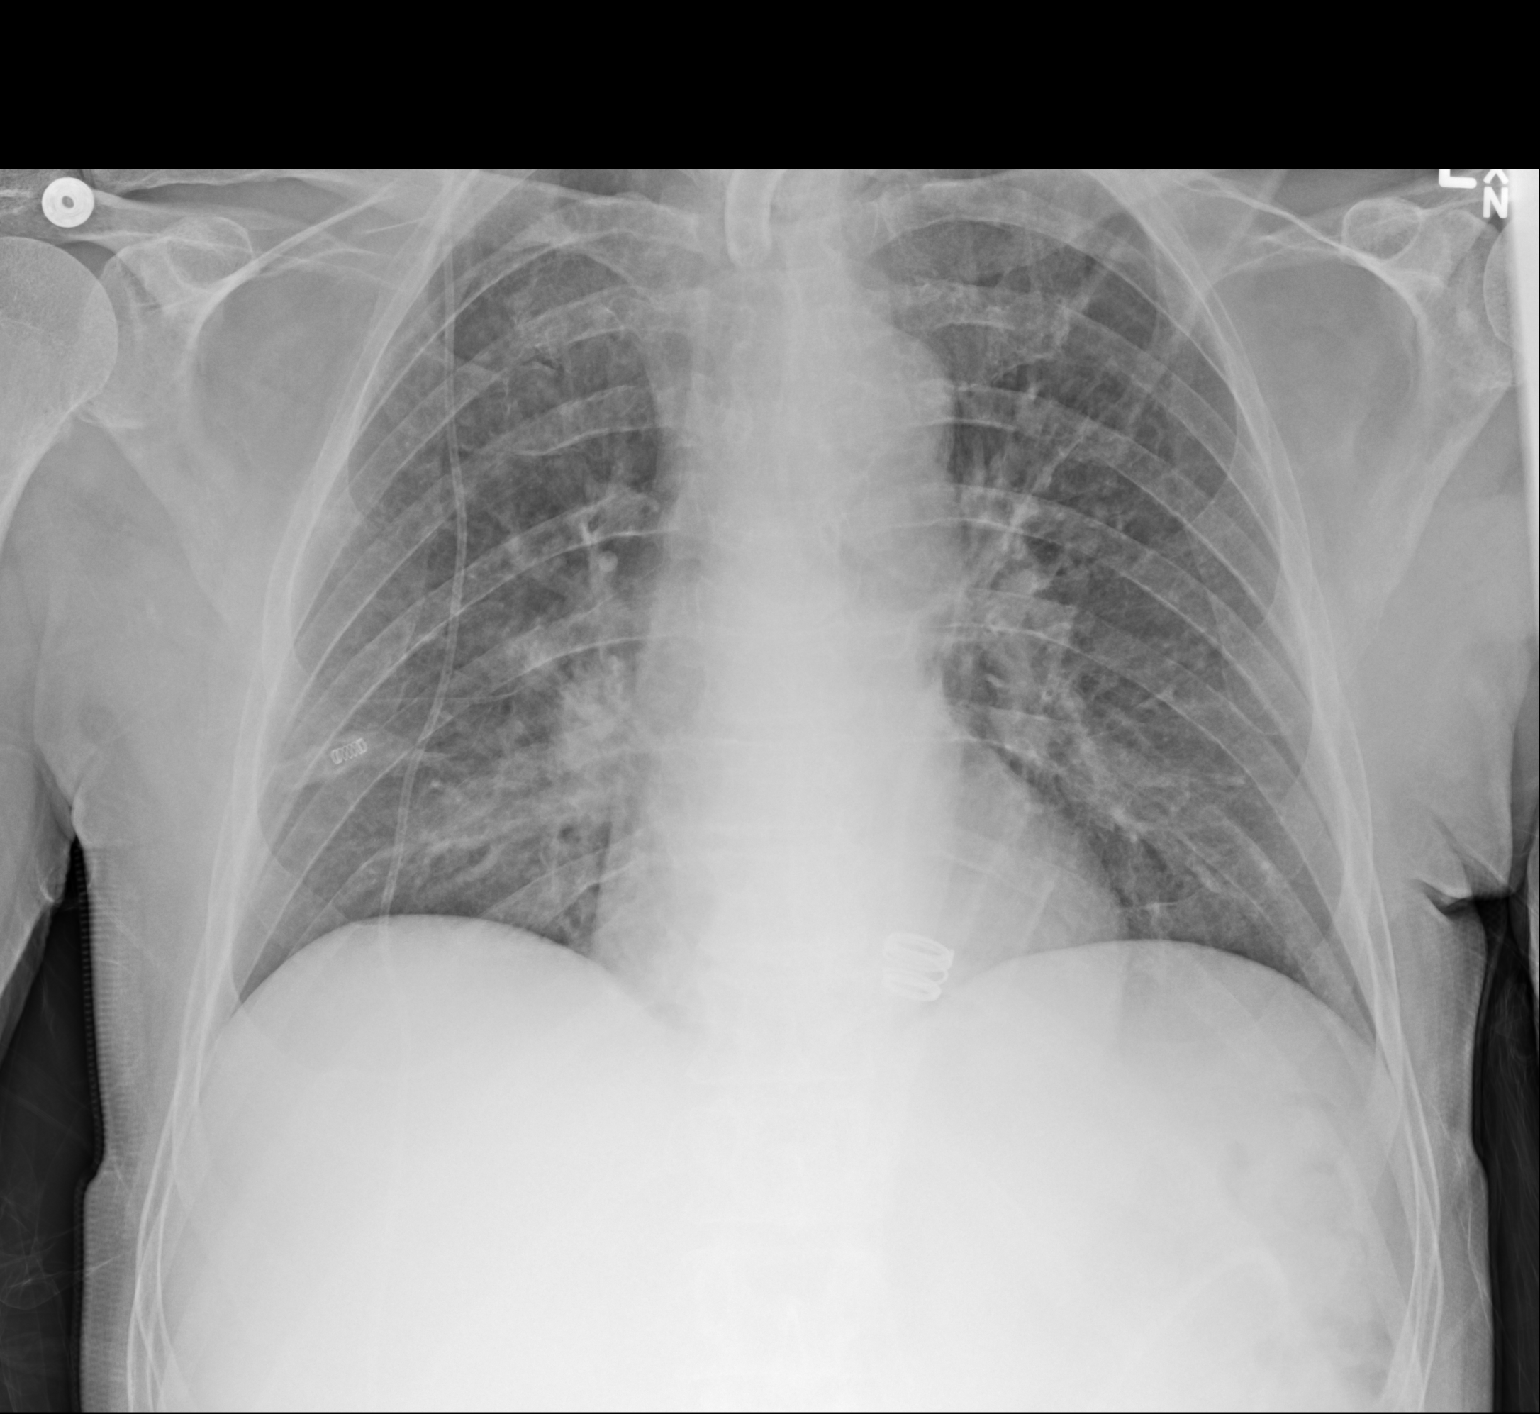

[1 of 1 positions shown; findings below may reference images not displayed]

FINDINGS: Tracheostomy in satisfactory position. Ventriculoperitoneal tubing
is present on the right

The lungs are clear.  Negative for pneumonia or heart failure.
IMPRESSION: No active disease.
